# Patient Record
Sex: Male | Born: 1945
Health system: Southern US, Community
[De-identification: ages and names within clinical notes are randomized; demographics above are authoritative.]

## PROBLEM LIST (undated history)

## (undated) DIAGNOSIS — N200 Calculus of kidney: Secondary | ICD-10-CM

---

## 2002-10-25 ENCOUNTER — Emergency Department (HOSPITAL_COMMUNITY): Admission: AD | Admit: 2002-10-25 | Discharge: 2002-10-25 | Payer: Self-pay | Admitting: Emergency Medicine

## 2009-05-17 ENCOUNTER — Emergency Department (HOSPITAL_COMMUNITY): Admission: EM | Admit: 2009-05-17 | Discharge: 2009-05-17 | Payer: Self-pay | Admitting: Family Medicine

## 2010-06-07 LAB — POCT URINALYSIS DIP (DEVICE)
Glucose, UA: NEGATIVE mg/dL
Ketones, ur: NEGATIVE mg/dL
Protein, ur: NEGATIVE mg/dL
Specific Gravity, Urine: 1.015 (ref 1.005–1.030)

## 2011-03-24 IMAGING — CR DG ABDOMEN 1V
1 series · 1 of 1 positions shown · non-contrast
Comparison: None.

CLINICAL DATA: Left flank pain

ABDOMEN - 1 VIEW

[view not recorded]
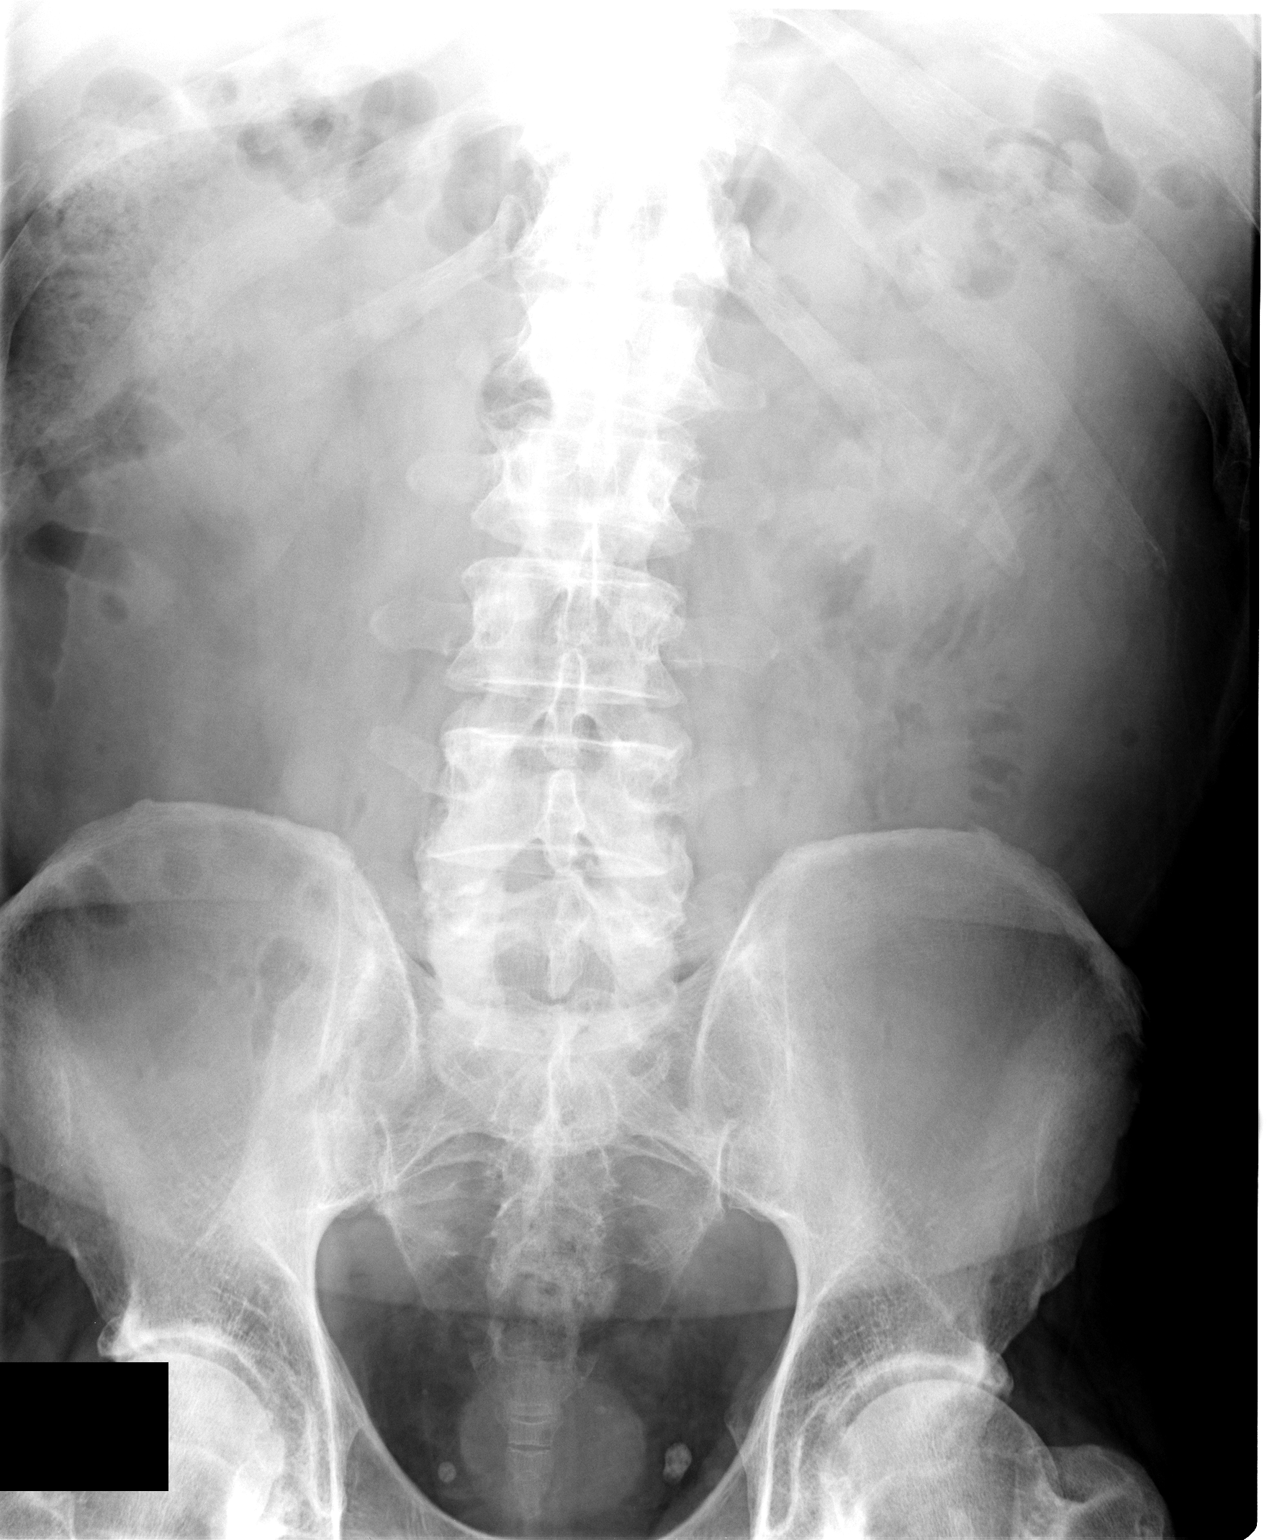

[1 of 1 positions shown; findings below may reference images not displayed]

FINDINGS: No abnormal calcific opacity is seen over the abdomen.
Normal bowel gas pattern. Bones are unremarkable.  Presence or
absence of air fluid levels or free air cannot be assessed on this
supine radiograph. Phleboliths are noted over the pelvis. An oval
opacity over the pelvis may represent the nondistended bladder.
There is a 2 mm radiopacity overlying the expected location of the
left ureterovesicular junction.
IMPRESSION: Left ureteral vesicular junction 2 mm calculus versus phlebolith
noted in the pelvis.

## 2012-01-11 ENCOUNTER — Encounter (HOSPITAL_COMMUNITY): Payer: Self-pay

## 2012-01-11 ENCOUNTER — Emergency Department (HOSPITAL_COMMUNITY)
Admission: EM | Admit: 2012-01-11 | Discharge: 2012-01-11 | Disposition: A | Discharge status: Home / Self Care | Payer: BC Managed Care – PPO | Source: Home / Self Care

## 2012-01-11 DIAGNOSIS — R3129 Other microscopic hematuria: Secondary | ICD-10-CM

## 2012-01-11 DIAGNOSIS — R52 Pain, unspecified: Secondary | ICD-10-CM

## 2012-01-11 DIAGNOSIS — R109 Unspecified abdominal pain: Secondary | ICD-10-CM

## 2012-01-11 HISTORY — DX: Calculus of kidney: N20.0

## 2012-01-11 LAB — POCT URINALYSIS DIP (DEVICE)
Glucose, UA: NEGATIVE mg/dL
Specific Gravity, Urine: 1.02 (ref 1.005–1.030)
Urobilinogen, UA: 0.2 mg/dL (ref 0.0–1.0)
pH: 6.5 (ref 5.0–8.0)

## 2012-01-11 MED ORDER — OXYCODONE-ACETAMINOPHEN 5-325 MG PO TABS: 1.0000 | ORAL_TABLET | ORAL | Status: DC | PRN

## 2012-01-11 NOTE — ED Notes (Signed)
History of known kidney stones, "feels like another"; denies pain at present

## 2012-01-11 NOTE — Discharge Instructions (Signed)
Abdominal Pain Abdominal pain can be caused by many things. Your caregiver decides the seriousness of your pain by an examination and possibly blood tests and X-rays. Many cases can be observed and treated at home. Most abdominal pain is not caused by a disease and will probably improve without treatment. However, in many cases, more time must pass before a clear cause of the pain can be found. Before that point, it may not be known if you need more testing, or if hospitalization or surgery is needed. HOME CARE INSTRUCTIONS   Do not take laxatives unless directed by your caregiver.  Take pain medicine only as directed by your caregiver.  Only take over-the-counter or prescription medicines for pain, discomfort, or fever as directed by your caregiver.  Try a clear liquid diet (broth, tea, or water) for as long as directed by your caregiver. Slowly move to a bland diet as tolerated. SEEK IMMEDIATE MEDICAL CARE IF:   The pain does not go away.  You have a fever.  You keep throwing up (vomiting).  The pain is felt only in portions of the abdomen. Pain in the right side could possibly be appendicitis. In an adult, pain in the left lower portion of the abdomen could be colitis or diverticulitis.  You pass bloody or black tarry stools. MAKE SURE YOU:   Understand these instructions.  Will watch your condition.  Will get help right away if you are not doing well or get worse. Document Released: 12/08/2004 Document Revised: 05/23/2011 Document Reviewed: 10/17/2007 [facility] Patient Information 2013 [place], [place]. Flank Pain Flank pain refers to pain that is located on the side of the body between the upper abdomen and the back. It can be caused by many things. CAUSES  Some of the more common causes of flank pain include:  Muscle strain.  Muscle spasms.  A disease of your spine (vertebral disk disease).  A lung infection (pneumonia).  Fluid around your lungs (pulmonary edema).  A  kidney infection.  Kidney stones.  A very painful skin rash on only one side of your body (shingles).  Gallbladder disease. DIAGNOSIS  Blood tests, urine tests, and X-rays may help your caregiver determine what is wrong. TREATMENT  The treatment of pain depends on the cause. Your caregiver will determine what treatment will work best for you. HOME CARE INSTRUCTIONS   Home care will depend on the cause of your pain.  Some medications may help relieve the pain. Take medication for relief of pain as directed by your caregiver.  Tell your caregiver about any changes in your pain.  Follow up with your caregiver. SEEK IMMEDIATE MEDICAL CARE IF:   Your pain is not controlled with medication.  The pain increases.  You have abdominal pain.  You have shortness of breath.  You have persistent nausea or vomiting.  You have swelling in your abdomen.  You feel faint or pass out.  You have a temperature by mouth above 102 F (38.9 C), not controlled by medicine. MAKE SURE YOU:   Understand these instructions.  Will watch your condition.  Will get help right away if you are not doing well or get worse. Document Released: 04/21/2005 Document Revised: 05/23/2011 Document Reviewed: 08/15/2009 [facility] Patient Information 2013 [place], [place]. Hematuria, Adult Hematuria (blood in your urine) can be caused by a bladder infection (cystitis), kidney infection (pyelonephritis), prostate infection (prostatitis), or kidney stone. Infections will usually respond to antibiotics (medications which kill germs), and a kidney stone will usually pass through  your urine without further treatment. If you were put on antibiotics, take all the medicine until gone. You may feel better in a few days, but take all of your medicine or the infection may not respond and become more difficult to treat. If antibiotics were not given, an infection did not cause the blood in the urine. A further work up to find out  the reason may be needed. HOME CARE INSTRUCTIONS   Drink lots of fluid, 3 to 4 quarts a day. If you have been diagnosed with an infection, cranberry juice is especially recommended, in addition to large amounts of water.  Avoid caffeine, tea, and carbonated beverages, because they tend to irritate the bladder.  Avoid alcohol as it may irritate the prostate.  Only take over-the-counter or prescription medicines for pain, discomfort, or fever as directed by your caregiver.  If you have been diagnosed with a kidney stone follow your caregivers instructions regarding straining your urine to catch the stone. TO PREVENT FURTHER INFECTIONS:  Empty the bladder often. Avoid holding urine for long periods of time.  After a bowel movement, women should cleanse front to back. Use each tissue only once.  Empty the bladder before and after sexual intercourse if you are a male.  Return to your caregiver if you develop back pain, fever, nausea (feeling sick to your stomach), vomiting, or your symptoms (problems) are not better in 3 days. Return sooner if you are getting worse. If you have been requested to return for further testing make sure to keep your appointments. If an infection is not the cause of blood in your urine, X-rays may be required. Your caregiver will discuss this with you. SEEK IMMEDIATE MEDICAL CARE IF:   You have a persistent fever over 102 F (38.9 C).  You develop severe vomiting and are unable to keep the medication down.  You develop severe back or abdominal pain despite taking your medications.  You begin passing a large amount of blood or clots in your urine.  You feel extremely weak or faint, or pass out. MAKE SURE YOU:   Understand these instructions.  Will watch your condition.  Will get help right away if you are not doing well or get worse. Document Released: 02/28/2005 Document Revised: 05/23/2011 Document Reviewed: 10/18/2007 [facility] Patient Information  2013 [place], [place]. Hematuria, Adult Hematuria (blood in your urine) can be caused by a bladder infection (cystitis), kidney infection (pyelonephritis), prostate infection (prostatitis), or kidney stone. Infections will usually respond to antibiotics (medications which kill germs), and a kidney stone will usually pass through your urine without further treatment. If you were put on antibiotics, take all the medicine until gone. You may feel better in a few days, but take all of your medicine or the infection may not respond and become more difficult to treat. If antibiotics were not given, an infection did not cause the blood in the urine. A further work up to find out the reason may be needed. HOME CARE INSTRUCTIONS   Drink lots of fluid, 3 to 4 quarts a day. If you have been diagnosed with an infection, cranberry juice is especially recommended, in addition to large amounts of water.  Avoid caffeine, tea, and carbonated beverages, because they tend to irritate the bladder.  Avoid alcohol as it may irritate the prostate.  Only take over-the-counter or prescription medicines for pain, discomfort, or fever as directed by your caregiver.  If you have been diagnosed with a kidney stone follow your caregivers instructions  regarding straining your urine to catch the stone. TO PREVENT FURTHER INFECTIONS:  Empty the bladder often. Avoid holding urine for long periods of time.  After a bowel movement, women should cleanse front to back. Use each tissue only once.  Empty the bladder before and after sexual intercourse if you are a male.  Return to your caregiver if you develop back pain, fever, nausea (feeling sick to your stomach), vomiting, or your symptoms (problems) are not better in 3 days. Return sooner if you are getting worse. If you have been requested to return for further testing make sure to keep your appointments. If an infection is not the cause of blood in your urine, X-rays may be  required. Your caregiver will discuss this with you. SEEK IMMEDIATE MEDICAL CARE IF:   You have a persistent fever over 102 F (38.9 C).  You develop severe vomiting and are unable to keep the medication down.  You develop severe back or abdominal pain despite taking your medications.  You begin passing a large amount of blood or clots in your urine.  You feel extremely weak or faint, or pass out. MAKE SURE YOU:   Understand these instructions.  Will watch your condition.  Will get help right away if you are not doing well or get worse. Document Released: 02/28/2005 Document Revised: 05/23/2011 Document Reviewed: 10/18/2007 [facility] Patient Information 2013 [place], [place].

## 2012-01-11 NOTE — ED Notes (Signed)
Provided w urine strainer, to call Urologist in AM

## 2012-01-11 NOTE — ED Provider Notes (Signed)
Medical screening examination/treatment/procedure(s) were performed by resident physician or non-physician practitioner and as supervising physician I was immediately available for consultation/collaboration.   KINDL,JAMES DOUGLAS MD.    James D Kindl, MD 01/11/12 1737 

## 2012-01-11 NOTE — ED Provider Notes (Signed)
History     CSN: [id number]  Arrival date & time 01/11/12  1411   None     Chief Complaint  Patient presents with  . Flank Pain    (Consider location/radiation/quality/duration/timing/severity/associated sxs/prior treatment) HPI Comments: 4 days ago this 66 year old man developed pain in his right lateral abdomen along the right costal margin. The pain lasted for approximately 3-4 hours it was a crescendo decrescendo type of pain. Saturday he had the same type of pain but described it as severe and lasting longer. The following day Sunday, he had no pain, however the following day the pain returned anddescribed it as mild and short-lived. He had same type pain last night and was moderate to severe and colicky in nature. The pain remained his right lateral abdomen/flank and on occasion radiated medially. Denies pain in the genitalia. He states his urine has been darker than usual. Denies nausea vomiting or abdominal pain. He has a history of kidney stones.   Past Medical History  Diagnosis Date  . Kidney stone     History reviewed. No pertinent past surgical history.  History reviewed. No pertinent family history.  History  Substance Use Topics  . Smoking status: Not on file  . Smokeless tobacco: Not on file  . Alcohol Use:       Review of Systems  Constitutional: Negative for fever, diaphoresis, activity change and fatigue.  HENT: Negative.  Negative for sore throat.   Eyes: Negative.   Respiratory: Negative for cough, chest tightness, shortness of breath and wheezing.   Cardiovascular: Negative for chest pain, palpitations and leg swelling.  Gastrointestinal: Negative.   Genitourinary: Positive for flank pain. Negative for dysuria, frequency, decreased urine volume, discharge, difficulty urinating, penile pain and testicular pain.  Musculoskeletal: Negative for back pain, joint swelling and arthralgias.  Skin: Negative for color change and rash.  Neurological:  Negative.   Psychiatric/Behavioral: Negative.  Negative for behavioral problems.    Allergies  Review of patient's allergies indicates no known allergies.  Home Medications   Current Outpatient Rx  Name Route Sig Dispense Refill  . OXYCODONE-ACETAMINOPHEN 5-325 MG PO TABS Oral Take 1 tablet by mouth every 4 (four) hours as needed for pain. 15 tablet 0    BP 146/94  Pulse 67  Temp 98.6 F (37 C) (Oral)  Resp 17  SpO2 97%  Physical Exam  Constitutional: He is oriented to person, place, and time. He appears well-developed and well-nourished. No distress.  HENT:  Head: Normocephalic and atraumatic.  Eyes: Conjunctivae normal and EOM are normal.  Neck: Normal range of motion. Neck supple.  Cardiovascular: Normal rate, regular rhythm and normal heart sounds.   Pulmonary/Chest: Effort normal and breath sounds normal. No respiratory distress. He has no wheezes.       No flank tenderness or percussion tenderness.  Abdominal: Soft. Bowel sounds are normal. He exhibits no distension and no mass. There is no tenderness. There is no rebound and no guarding.       Negative Murphy's. No hepatomegaly or splenomegaly.  Musculoskeletal: Normal range of motion. He exhibits no edema and no tenderness.  Neurological: He is alert and oriented to person, place, and time. No cranial nerve deficit.  Skin: Skin is warm and dry. No rash noted.  Psychiatric: He has a normal mood and affect.    ED Course  Procedures (including critical care time)  Labs Reviewed  POCT URINALYSIS DIP (DEVICE) - Abnormal; Notable for the following:    Ketones, ur TRACE (*)  Hgb urine dipstick LARGE (*)     All other components within normal limits   No results found.   1. Acute flank pain   2. Microscopic hematuria       MDM   Results for orders placed during the hospital encounter of 01/11/12  POCT URINALYSIS DIP (DEVICE)      Component Value Range   Glucose, UA NEGATIVE  NEGATIVE mg/dL   Bilirubin  Urine NEGATIVE  NEGATIVE   Ketones, ur TRACE (*) NEGATIVE mg/dL   Specific Gravity, Urine 1.020  1.005 - 1.030   Hgb urine dipstick LARGE (*) NEGATIVE   pH 6.5  5.0 - 8.0   Protein, ur NEGATIVE  NEGATIVE mg/dL   Urobilinogen, UA 0.2  0.0 - 1.0 mg/dL   Nitrite NEGATIVE  NEGATIVE   Leukocytes, UA NEGATIVE  NEGATIVE   During the visit he is not experiencing any symptoms. I called [facility] and made a referral. The patient was in instructed to call them back tomorrow with his insurance information and they'll make an appointment for him for followup. In the meantime if he develops severe pain unrelieved by the Percocet that I prescribed or develops  vomiting he should go to the emergency department.        Hayden Rasmussen, NP 01/11/12 [phone]

## 2012-10-21 ENCOUNTER — Encounter (HOSPITAL_COMMUNITY): Payer: Self-pay | Admitting: *Deleted

## 2012-10-21 ENCOUNTER — Emergency Department (INDEPENDENT_AMBULATORY_CARE_PROVIDER_SITE_OTHER)
Admission: EM | Admit: 2012-10-21 | Discharge: 2012-10-21 | Disposition: A | Discharge status: Home / Self Care | Payer: Medicare Other | Source: Home / Self Care | Attending: Emergency Medicine | Admitting: Emergency Medicine

## 2012-10-21 DIAGNOSIS — N200 Calculus of kidney: Secondary | ICD-10-CM

## 2012-10-21 HISTORY — DX: Calculus of kidney: N20.0

## 2012-10-21 LAB — POCT URINALYSIS DIP (DEVICE)
Bilirubin Urine: NEGATIVE
Ketones, ur: NEGATIVE mg/dL
Protein, ur: 100 mg/dL — AB
pH: 5 (ref 5.0–8.0)

## 2012-10-21 MED ORDER — ONDANSETRON HCL 4 MG PO TABS: 4.0000 mg | ORAL_TABLET | Freq: Three times a day (TID) | ORAL | Status: DC | PRN

## 2012-10-21 MED ORDER — OXYCODONE-ACETAMINOPHEN 5-325 MG PO TABS: 1.0000 | ORAL_TABLET | ORAL | Status: DC | PRN

## 2012-10-21 NOTE — ED Notes (Signed)
Urine strainer provided

## 2012-10-21 NOTE — ED Provider Notes (Signed)
Medical screening examination/treatment/procedure(s) were performed by resident physician practitioner and as supervising physician I was immediately available for consultation/collaboration.  Raynald Blend, MD 10/21/12 [phone]

## 2012-10-21 NOTE — ED Provider Notes (Signed)
CSN: [id number]     Arrival date & time 10/21/12  1755 History     First MD Initiated Contact with Patient 10/21/12 1807     Chief Complaint  Patient presents with  . Nephrolithiasis   (Consider location/radiation/quality/duration/timing/severity/associated sxs/prior Treatment) HPI Pt is a 67 yo M with lower abdominal pain since this morning. He has tried [employer] which has not helped much. He has history of kidney stones, last attack was in October of last year. This current pain feels like his prior kidney stones. He has not seen a urologist recently. He reports nausea. He has not noticed any dysuria or hematuria. He states he drinks a few glasses of wine a few times per week, but that has not increased lately. He is not able to identify any triggers of his kidney stones.   Past Medical History  Diagnosis Date  . Kidney stone   . Kidney stones    History reviewed. No pertinent past surgical history. No family history on file. History  Substance Use Topics  . Smoking status: Never Smoker   . Smokeless tobacco: Not on file  . Alcohol Use: Yes     Comment: occasional    Review of Systems  Constitutional: Positive for fatigue. Negative for fever and chills.  HENT: Negative for congestion and trouble swallowing.   Eyes: Negative for visual disturbance.  Respiratory: Negative for chest tightness and shortness of breath.   Cardiovascular: Negative for chest pain.  Gastrointestinal: Positive for nausea and abdominal pain. Negative for vomiting, diarrhea, constipation, blood in stool and abdominal distention.  Genitourinary: Positive for flank pain. Negative for dysuria, urgency and difficulty urinating.  Musculoskeletal: Positive for back pain.  Skin: Negative for rash.  Neurological: Negative for headaches.  Hematological: Negative for adenopathy.    Allergies  Review of patient's allergies indicates no known allergies.  Home Medications   Current Outpatient Rx  Name  Route   Sig  Dispense  Refill  . ondansetron (ZOFRAN) 4 MG tablet   Oral   Take 1 tablet (4 mg total) by mouth every 8 (eight) hours as needed for nausea.   10 tablet   0   . oxyCODONE-acetaminophen (PERCOCET/ROXICET) 5-325 MG per tablet   Oral   Take 1 tablet by mouth every 4 (four) hours as needed for pain.   20 tablet   0    BP 178/98  Pulse 52  Temp(Src) 98.2 F (36.8 C) (Oral)  Resp 18  SpO2 97% Physical Exam  Constitutional: He is oriented to person, place, and time. He appears well-developed and well-nourished. He appears distressed (Appears very uncomfortable ).  HENT:  Head: Normocephalic and atraumatic.  Mouth/Throat: Oropharynx is clear and moist.  Cardiovascular: Normal rate, regular rhythm and normal heart sounds.   No murmur heard. Pulmonary/Chest: Effort normal and breath sounds normal. He has no wheezes.  Abdominal: Soft. He exhibits no distension and no mass. There is tenderness (Diffuse RLQ tenderness. Mild flank pain on the right). There is guarding. There is no rebound.  Musculoskeletal: Normal range of motion. He exhibits no edema.  Neurological: He is alert and oriented to person, place, and time. No cranial nerve deficit.  Skin: Skin is warm and dry. No rash noted.    ED Course   Procedures (including critical care time)  Labs Reviewed  POCT URINALYSIS DIP (DEVICE) - Abnormal; Notable for the following:    Hgb urine dipstick LARGE (*)    Protein, ur 100 (*)    All  other components within normal limits   No results found. 1. Nephrolithiasis     MDM  Known nephrolithiasis, now with recurrence. UA also suggestive of same.  Patient declined pain medication here since he is driving himself. Will give Percocet #20 and Zofran #10 for pain and nausea. Encouraged to drink lots of fluids. F/u at Urgent Care if does not improve in next 48 hours. Given information for [facility] and patient asked to call to schedule a new patient appointment so they  can arrange appropriate Urology follow up, if needed.  Hilarie Fredrickson, MD 10/21/12 [phone]

## 2012-10-21 NOTE — ED Notes (Signed)
C/O kidney stone pain in right flank since this morning.  Has nausea and has had episodes diaphoresis.  Pt very pale and sweaty at present.  States he drove himself - instructed pt that he needs a ride home so we can give him pain meds - states he's rather wait so he can drive home - strongly encouraged finding a ride home, but pt continues to state he would rather wait until he gets home to take pain meds.  States HR normally in 50's.  Denies HTN hx.

## 2017-01-03 DIAGNOSIS — L820 Inflamed seborrheic keratosis: Secondary | ICD-10-CM | POA: Diagnosis not present

## 2017-01-03 DIAGNOSIS — X32XXXD Exposure to sunlight, subsequent encounter: Secondary | ICD-10-CM | POA: Diagnosis not present

## 2017-01-03 DIAGNOSIS — L565 Disseminated superficial actinic porokeratosis (DSAP): Secondary | ICD-10-CM | POA: Diagnosis not present

## 2017-01-03 DIAGNOSIS — L570 Actinic keratosis: Secondary | ICD-10-CM | POA: Diagnosis not present

## 2017-11-02 ENCOUNTER — Encounter (HOSPITAL_COMMUNITY): Payer: Self-pay

## 2017-11-02 ENCOUNTER — Other Ambulatory Visit: Payer: Self-pay

## 2017-11-02 ENCOUNTER — Ambulatory Visit (HOSPITAL_COMMUNITY)
Admission: EM | Admit: 2017-11-02 | Discharge: 2017-11-02 | Disposition: A | Discharge status: Home / Self Care | Payer: Medicare Other | Attending: Family Medicine | Admitting: Family Medicine

## 2017-11-02 DIAGNOSIS — R319 Hematuria, unspecified: Secondary | ICD-10-CM

## 2017-11-02 DIAGNOSIS — R109 Unspecified abdominal pain: Secondary | ICD-10-CM | POA: Diagnosis not present

## 2017-11-02 DIAGNOSIS — N200 Calculus of kidney: Secondary | ICD-10-CM

## 2017-11-02 LAB — POCT URINALYSIS DIP (DEVICE)
Bilirubin Urine: NEGATIVE
GLUCOSE, UA: NEGATIVE mg/dL
Ketones, ur: NEGATIVE mg/dL
Nitrite: NEGATIVE
PH: 5.5 (ref 5.0–8.0)
PROTEIN: 30 mg/dL — AB
SPECIFIC GRAVITY, URINE: 1.015 (ref 1.005–1.030)
Urobilinogen, UA: 0.2 mg/dL (ref 0.0–1.0)

## 2017-11-02 MED ORDER — ONDANSETRON HCL 4 MG PO TABS: 4.0000 mg | ORAL_TABLET | Freq: Four times a day (QID) | ORAL | 0 refills | Status: DC

## 2017-11-02 MED ORDER — TAMSULOSIN HCL 0.4 MG PO CAPS: 0.4000 mg | ORAL_CAPSULE | Freq: Every day | ORAL | 0 refills | Status: DC

## 2017-11-02 MED ORDER — KETOROLAC TROMETHAMINE 60 MG/2ML IM SOLN
60.0000 mg | Freq: Once | INTRAMUSCULAR | Status: AC
  Administered 2017-11-02: 60 mg via INTRAMUSCULAR

## 2017-11-02 MED ORDER — KETOROLAC TROMETHAMINE 60 MG/2ML IM SOLN
INTRAMUSCULAR | Status: AC
  Filled 2017-11-02: qty 2

## 2017-11-02 MED ORDER — TRAMADOL HCL 50 MG PO TABS: 50.0000 mg | ORAL_TABLET | Freq: Two times a day (BID) | ORAL | 0 refills | Status: DC | PRN

## 2017-11-02 MED ORDER — IBUPROFEN 800 MG PO TABS: 800.0000 mg | ORAL_TABLET | Freq: Three times a day (TID) | ORAL | 0 refills | Status: DC | PRN

## 2017-11-02 NOTE — Discharge Instructions (Addendum)
Urine showed blood.  This finding in combination with your symptoms suggests kidney stones.  Drink plenty of fluids and get rest Strain all urine and follow up with PCP with specimen Use flomax as directed  Prescribed ibuprofen.  Take as directed for mild to moderate pain Norco prescribed.  Use as needed for severe or break-through pain Follow up with PCP or make appointment with Urologist if symptoms persist Return or go to ER if you have any new or worsening symptoms (difficulty urinating, blood in urine, pain that does not moderate with medication, fever, chills, abdominal pain, etc...)

## 2017-11-02 NOTE — ED Triage Notes (Signed)
Left side flank pain x 4 days.

## 2017-11-02 NOTE — ED Provider Notes (Signed)
[facility]   CC: Left flank pain  SUBJECTIVE:  Adam Carr is a 72 y.o. male who complains of left flank pain for the past 4 days.  Patient denies a precipitating event or specific injury.  Localizes the pain to the left flank. Pain is intermittent and sharp.  Has tried motrin with minimal relief.  Denies aggravating factors.  Admits to similar symptoms in the past with hx of kidney stones.  Reports one episode of nausea and vomiting on Monday, now resolved.  Denies fever, chills, abdominal pain, or hematuria.    LMP: No LMP for male patient.  ROS: As in HPI.  Past Medical History:  Diagnosis Date  . Kidney stone   . Kidney stones    History reviewed. No pertinent surgical history. No Known Allergies No current facility-administered medications on file prior to encounter.    No current outpatient medications on file prior to encounter.   Social History   Socioeconomic History  . Marital status: Divorced    Spouse name: Not on file  . Number of children: Not on file  . Years of education: Not on file  . Highest education level: Not on file  Occupational History  . Not on file  Social Needs  . Financial resource strain: Not on file  . Food insecurity:    Worry: Not on file    Inability: Not on file  . Transportation needs:    Medical: Not on file    Non-medical: Not on file  Tobacco Use  . Smoking status: Never Smoker  . Smokeless tobacco: Never Used  Substance and Sexual Activity  . Alcohol use: Yes    Comment: occasional  . Drug use: No  . Sexual activity: Not on file  Lifestyle  . Physical activity:    Days per week: Not on file    Minutes per session: Not on file  . Stress: Not on file  Relationships  . Social connections:    Talks on phone: Not on file    Gets together: Not on file    Attends religious service: Not on file    Active member of club or organization: Not on file    Attends meetings of clubs or organizations: Not on file     Relationship status: Not on file  . Intimate partner violence:    Fear of current or ex partner: Not on file    Emotionally abused: Not on file    Physically abused: Not on file    Forced sexual activity: Not on file  Other Topics Concern  . Not on file  Social History Narrative  . Not on file   History reviewed. No pertinent family history.  OBJECTIVE:  Vitals:   11/02/17 1432 11/02/17 1433  BP: (!) 167/95   Pulse: 67   Resp: 18   Temp: 98.1 F (36.7 C)   TempSrc: Oral   SpO2: 100%   Weight:  172 lb (78 kg)   General appearance: AOx3 in no acute distress HEENT: NCAT.  Oropharynx clear.  Lungs: clear to auscultation bilaterally without adventitious breath sounds Heart: regular rate and rhythm.  Radial pulses 2+ symmetrical bilaterally Abdomen: soft; non-distended; left sided flank tenderness; bowel sounds present; no masses or organomegaly; no guarding or rebound tenderness Back: no CVA tenderness Extremities: no edema; symmetrical with no gross deformities Skin: warm and dry Neurologic: Ambulates from chair to exam table without difficulty Psychological: alert and cooperative; normal mood and affect  Labs Reviewed  POCT URINALYSIS DIP (DEVICE) - Abnormal; Notable for the following components:      Result Value   Hgb urine dipstick MODERATE (*)    Protein, ur 30 (*)    Leukocytes, UA TRACE (*)    All other components within normal limits    ASSESSMENT & PLAN:  1. Kidney stone   2. Hematuria, unspecified type   3. Left flank pain     Meds ordered this encounter  Medications  . tamsulosin (FLOMAX) 0.4 MG CAPS capsule    Sig: Take 1 capsule (0.4 mg total) by mouth daily.    Dispense:  30 capsule    Refill:  0    Order Specific Question:   Supervising Provider    Answer:   Isa RankinMURRAY, LAURA WILSON [phone][[id number]]  . ibuprofen (ADVIL,MOTRIN) 800 MG tablet    Sig: Take 1 tablet (800 mg total) by mouth 3 (three) times daily as needed for moderate pain.    Dispense:   30 tablet    Refill:  0    Order Specific Question:   Supervising Provider    Answer:   Isa RankinMURRAY, LAURA WILSON [phone][[id number]]  . traMADol (ULTRAM) 50 MG tablet    Sig: Take 1 tablet (50 mg total) by mouth every 12 (twelve) hours as needed for severe pain (for break-through pain).    Dispense:  10 tablet    Refill:  0    Order Specific Question:   Supervising Provider    Answer:   Isa RankinMURRAY, LAURA WILSON [phone][[id number]]  . ketorolac (TORADOL) injection 60 mg  . ondansetron (ZOFRAN) 4 MG tablet    Sig: Take 1 tablet (4 mg total) by mouth every 6 (six) hours.    Dispense:  12 tablet    Refill:  0    Order Specific Question:   Supervising Provider    Answer:   Isa RankinMURRAY, LAURA WILSON [[id number]]   Urine showed blood.  This finding in combination with your symptoms suggests kidney stones.  Drink plenty of fluids and get rest Strain all urine and follow up with PCP with specimen Use flomax as directed  Prescribed ibuprofen.  Take as directed for mild to moderate pain Norco prescribed.  Use as needed for severe or break-through pain Follow up with PCP or make appointment with Urologist if symptoms persist Prescribed zofran as needed for nausea.   Return or go to ER if you have any new or worsening symptoms (difficulty urinating, blood in urine, pain that does not moderate with medication, fever, chills, abdominal pain, etc...)  Outlined signs and symptoms indicating need for more acute intervention. Patient verbalized understanding. After Visit Summary given.     Rennis HardingWurst, Lakhia Gengler, PA-C 11/02/17 1614

## 2018-10-12 DIAGNOSIS — R509 Fever, unspecified: Secondary | ICD-10-CM | POA: Diagnosis not present

## 2019-05-30 DIAGNOSIS — Z23 Encounter for immunization: Secondary | ICD-10-CM | POA: Diagnosis not present

## 2019-06-20 DIAGNOSIS — Z23 Encounter for immunization: Secondary | ICD-10-CM | POA: Diagnosis not present

## 2019-09-26 DIAGNOSIS — H25013 Cortical age-related cataract, bilateral: Secondary | ICD-10-CM | POA: Diagnosis not present

## 2019-09-26 DIAGNOSIS — H2512 Age-related nuclear cataract, left eye: Secondary | ICD-10-CM | POA: Diagnosis not present

## 2019-09-26 DIAGNOSIS — H18413 Arcus senilis, bilateral: Secondary | ICD-10-CM | POA: Diagnosis not present

## 2019-09-26 DIAGNOSIS — H25043 Posterior subcapsular polar age-related cataract, bilateral: Secondary | ICD-10-CM | POA: Diagnosis not present

## 2019-09-26 DIAGNOSIS — H2513 Age-related nuclear cataract, bilateral: Secondary | ICD-10-CM | POA: Diagnosis not present

## 2019-11-11 DIAGNOSIS — H2512 Age-related nuclear cataract, left eye: Secondary | ICD-10-CM | POA: Diagnosis not present

## 2019-11-11 DIAGNOSIS — H2513 Age-related nuclear cataract, bilateral: Secondary | ICD-10-CM | POA: Diagnosis not present

## 2019-11-11 DIAGNOSIS — H52202 Unspecified astigmatism, left eye: Secondary | ICD-10-CM | POA: Diagnosis not present

## 2019-11-12 DIAGNOSIS — H25041 Posterior subcapsular polar age-related cataract, right eye: Secondary | ICD-10-CM | POA: Diagnosis not present

## 2019-11-12 DIAGNOSIS — H2511 Age-related nuclear cataract, right eye: Secondary | ICD-10-CM | POA: Diagnosis not present

## 2019-11-12 DIAGNOSIS — H25011 Cortical age-related cataract, right eye: Secondary | ICD-10-CM | POA: Diagnosis not present

## 2019-11-25 ENCOUNTER — Encounter (HOSPITAL_COMMUNITY): Payer: Self-pay | Admitting: Emergency Medicine

## 2019-11-25 ENCOUNTER — Other Ambulatory Visit: Payer: Self-pay

## 2019-11-25 ENCOUNTER — Ambulatory Visit (HOSPITAL_COMMUNITY)
Admission: EM | Admit: 2019-11-25 | Discharge: 2019-11-25 | Disposition: A | Discharge status: Home / Self Care | Payer: Medicare Other | Attending: Family Medicine | Admitting: Family Medicine

## 2019-11-25 DIAGNOSIS — N200 Calculus of kidney: Secondary | ICD-10-CM

## 2019-11-25 DIAGNOSIS — R109 Unspecified abdominal pain: Secondary | ICD-10-CM | POA: Diagnosis not present

## 2019-11-25 LAB — POCT URINALYSIS DIPSTICK, ED / UC
Bilirubin Urine: NEGATIVE
Glucose, UA: 100 mg/dL — AB
Leukocytes,Ua: NEGATIVE
Nitrite: NEGATIVE
Protein, ur: 30 mg/dL — AB
Specific Gravity, Urine: >=1.03 (ref 1.005–1.030)
Urobilinogen, UA: 0.2 mg/dL (ref 0.0–1.0)
pH: 5 (ref 5.0–8.0)

## 2019-11-25 LAB — CBG MONITORING, ED: Glucose-Capillary: 166 mg/dL — ABNORMAL HIGH (ref 70–99)

## 2019-11-25 MED ORDER — KETOROLAC TROMETHAMINE 30 MG/ML IJ SOLN
30.0000 mg | Freq: Once | INTRAMUSCULAR | Status: AC
  Administered 2019-11-25: 30 mg via INTRAMUSCULAR

## 2019-11-25 MED ORDER — ONDANSETRON 4 MG PO TBDP: 4.0000 mg | ORAL_TABLET | Freq: Three times a day (TID) | ORAL | 0 refills | Status: AC | PRN

## 2019-11-25 MED ORDER — ONDANSETRON 4 MG PO TBDP
4.0000 mg | ORAL_TABLET | Freq: Once | ORAL | Status: AC
  Administered 2019-11-25: 4 mg via ORAL

## 2019-11-25 MED ORDER — KETOROLAC TROMETHAMINE 30 MG/ML IJ SOLN
INTRAMUSCULAR | Status: AC
  Filled 2019-11-25: qty 1

## 2019-11-25 MED ORDER — TAMSULOSIN HCL 0.4 MG PO CAPS: 0.4000 mg | ORAL_CAPSULE | Freq: Every day | ORAL | 0 refills | Status: AC

## 2019-11-25 MED ORDER — ONDANSETRON 4 MG PO TBDP
ORAL_TABLET | ORAL | Status: AC
  Filled 2019-11-25: qty 1

## 2019-11-25 MED ORDER — HYDROCODONE-ACETAMINOPHEN 5-325 MG PO TABS: 1.0000 | ORAL_TABLET | Freq: Four times a day (QID) | ORAL | 0 refills | Status: AC | PRN

## 2019-11-25 NOTE — ED Triage Notes (Addendum)
Abdominal pain started at 2 am, woke patient from sleep.  Feels weak, nauseated, no vomiting.  Pain in right lower abdomen.   reports feeling fine yesterday, normal day  Last bm yesterday and normal per patient.  Denies urinary symptoms

## 2019-11-25 NOTE — Discharge Instructions (Addendum)
I believe this is a kidney stone Gave you pain medication here and nausea medicines.  Drink plenty of water.  More medicines sent to the pharmacy.  You need to establish with a primary are doctor. I have put a contact on your discharge instructions. You blood sugar was elevated and you have  glucose in the urine.  Your  blood pressure was also very high today. It may be due to pain. This needs to be monitored.

## 2019-11-26 NOTE — ED Provider Notes (Addendum)
[facility]    CSN: [id number] Arrival date & time: 11/25/19  3888      History   Chief Complaint Chief Complaint  Patient presents with  . Abdominal Pain    HPI Adam Carr is a 74 y.o. male.   Patient is a 74 year old male with past medical history of kidney stones.  He is presenting today with right flank pain, nausea.  Woke up from  sleep at 2 AM.  Felt normal yesterday.  Reported normal BMs.  No vomiting or diarrhea.  No fevers or abdominal pain.     Past Medical History:  Diagnosis Date  . Kidney stone   . Kidney stones     There are no problems to display for this patient.   Past Surgical History:  Procedure Laterality Date  . CATARACT EXTRACTION         Home Medications    Prior to Admission medications   Medication Sig Start Date End Date Taking? Authorizing Provider  HYDROcodone-acetaminophen (NORCO/VICODIN) 5-325 MG tablet Take 1-2 tablets by mouth every 6 (six) hours as needed. 11/25/19   [name], NP  ondansetron (ZOFRAN ODT) 4 MG disintegrating tablet Take 1 tablet (4 mg total) by mouth every 8 (eight) hours as needed for nausea or vomiting. 11/25/19   [name], NP  tamsulosin (FLOMAX) 0.4 MG CAPS capsule Take 1 capsule (0.4 mg total) by mouth daily. 11/25/19   [name], NP    Family History History reviewed. No pertinent family history.  Social History Social History   Tobacco Use  . Smoking status: Never Smoker  . Smokeless tobacco: Never Used  Substance Use Topics  . Alcohol use: Yes    Comment: occasional  . Drug use: No     Allergies   Patient has no known allergies.   Review of Systems Review of Systems   Physical Exam Triage Vital Signs ED Triage Vitals  Enc Vitals Group     BP 11/25/19 0835 (!) 192/88     Pulse Rate 11/25/19 0835 (!) 55     Resp 11/25/19 0835 20     Temp 11/25/19 0835 98.7 F (37.1 C)     Temp Source 11/25/19 0835 Oral     SpO2 11/25/19 0835 97 %     Weight --       Height --      Head Circumference --      Peak Flow --      Pain Score 11/25/19 0830 10     Pain Loc --      Pain Edu? --      Excl. in GC? --    No data found.  Updated Vital Signs BP (!) 192/88 (BP Location: Left Arm)   Pulse (!) 55   Temp 98.7 F (37.1 C) (Oral)   Resp 20   SpO2 97%   Visual Acuity Right Eye Distance:   Left Eye Distance:   Bilateral Distance:    Right Eye Near:   Left Eye Near:    Bilateral Near:     Physical Exam Vitals and nursing note reviewed.  Constitutional:      Appearance: Normal appearance.     Comments: Appears in pain   HENT:     Head: Normocephalic and atraumatic.     Nose: Nose normal.  Eyes:     Conjunctiva/sclera: Conjunctivae normal.  Pulmonary:     Effort: Pulmonary effort is normal.  Abdominal:  Tenderness: There is right CVA tenderness.  Musculoskeletal:        General: Normal range of motion.     Cervical back: Normal range of motion.  Skin:    General: Skin is warm and dry.  Neurological:     Mental Status: He is alert.  Psychiatric:        Mood and Affect: Mood normal.      UC Treatments / Results  Labs (all labs ordered are listed, but only abnormal results are displayed) Labs Reviewed  POCT URINALYSIS DIPSTICK, ED / UC - Abnormal; Notable for the following components:      Result Value   Glucose, UA 100 (*)    Ketones, ur TRACE (*)    Hgb urine dipstick LARGE (*)    Protein, ur 30 (*)    All other components within normal limits  CBG MONITORING, ED - Abnormal; Notable for the following components:   Glucose-Capillary 166 (*)    All other components within normal limits    EKG   Radiology No results found.  Procedures Procedures (including critical care time)  Medications Ordered in UC Medications  ketorolac (TORADOL) 30 MG/ML injection 30 mg (30 mg Intramuscular Given 11/25/19 0918)  ondansetron (ZOFRAN-ODT) disintegrating tablet 4 mg (4 mg Oral Given 11/25/19 5638)    Initial  Impression / Assessment and Plan / UC Course  I have reviewed the triage vital signs and the nursing notes.  Pertinent labs & imaging results that were available during my care of the patient were reviewed by me and considered in my medical decision making (see chart for details).     Nephrolithiasis Patient with large blood on urinalysis. Presenting with right flank pain and similar symptoms to previous kidney stones.  Rating pain 10 out of 10. Toradol given here for pain. Zofran for nausea  Reassess patient and feeling much better after Toradol Blood pressure decreased to 164/80 Patient did have glucose in urine.  Blood sugar here 166.  No known history of diabetes. Recommended push fluids. For worsening symptoms go to the ER otherwise recommended follow-up with primary care  sent home with prescriptions for Flomax, hydrocodone for pain and Zofran for nausea as needed.  Final Clinical Impressions(s) / UC Diagnoses   Final diagnoses:  Nephrolithiasis     Discharge Instructions     I believe this is a kidney stone Gave you pain medication here and nausea medicines.  Drink plenty of water.  More medicines sent to the pharmacy.  You need to establish with a primary are doctor. I have put a contact on your discharge instructions. You blood sugar was elevated and you have  glucose in the urine.  Your  blood pressure was also very high today. It may be due to pain. This needs to be monitored.      ED Prescriptions    Medication Sig Dispense Auth. Provider   tamsulosin (FLOMAX) 0.4 MG CAPS capsule Take 1 capsule (0.4 mg total) by mouth daily. 30 capsule Yichen Gilardi A, NP   HYDROcodone-acetaminophen (NORCO/VICODIN) 5-325 MG tablet Take 1-2 tablets by mouth every 6 (six) hours as needed. 10 tablet Derrien Anschutz A, NP   ondansetron (ZOFRAN ODT) 4 MG disintegrating tablet Take 1 tablet (4 mg total) by mouth every 8 (eight) hours as needed for nausea or vomiting. 20 tablet Lorry Furber  A, NP     I have reviewed the PDMP during this encounter.   [name], NP 11/26/19 [phone]  [name], NP 11/26/19 [phone]

## 2019-11-29 DIAGNOSIS — H2513 Age-related nuclear cataract, bilateral: Secondary | ICD-10-CM | POA: Diagnosis not present

## 2019-11-29 DIAGNOSIS — H2511 Age-related nuclear cataract, right eye: Secondary | ICD-10-CM | POA: Diagnosis not present

## 2020-02-14 DIAGNOSIS — Z23 Encounter for immunization: Secondary | ICD-10-CM | POA: Diagnosis not present

## 2020-05-13 DIAGNOSIS — L570 Actinic keratosis: Secondary | ICD-10-CM | POA: Diagnosis not present

## 2020-05-13 DIAGNOSIS — X32XXXD Exposure to sunlight, subsequent encounter: Secondary | ICD-10-CM | POA: Diagnosis not present

## 2020-05-13 DIAGNOSIS — L565 Disseminated superficial actinic porokeratosis (DSAP): Secondary | ICD-10-CM | POA: Diagnosis not present

## 2020-07-12 DEATH — deceased
